# Patient Record
Sex: Female | Born: 2000 | Race: Black or African American | Hispanic: No | Marital: Single | Smoking: Never smoker
Health system: Southern US, Community
[De-identification: ages and names within clinical notes are randomized; demographics above are authoritative.]

## PROBLEM LIST (undated history)

## (undated) ENCOUNTER — Ambulatory Visit (HOSPITAL_COMMUNITY): Payer: Medicaid Other

## (undated) DIAGNOSIS — S82891A Other fracture of right lower leg, initial encounter for closed fracture: Secondary | ICD-10-CM

## (undated) DIAGNOSIS — Z87820 Personal history of traumatic brain injury: Secondary | ICD-10-CM

---
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
## 2021-05-25 DIAGNOSIS — Z01411 Encounter for gynecological examination (general) (routine) with abnormal findings: Secondary | ICD-10-CM | POA: Diagnosis not present

## 2021-05-25 DIAGNOSIS — R87610 Atypical squamous cells of undetermined significance on cytologic smear of cervix (ASC-US): Secondary | ICD-10-CM | POA: Diagnosis not present

## 2021-05-25 DIAGNOSIS — Z7251 High risk heterosexual behavior: Secondary | ICD-10-CM | POA: Diagnosis not present

## 2021-05-25 DIAGNOSIS — Z1239 Encounter for other screening for malignant neoplasm of breast: Secondary | ICD-10-CM | POA: Diagnosis not present

## 2021-05-25 DIAGNOSIS — R875 Abnormal microbiological findings in specimens from female genital organs: Secondary | ICD-10-CM | POA: Diagnosis not present

## 2021-05-25 DIAGNOSIS — B373 Candidiasis of vulva and vagina: Secondary | ICD-10-CM | POA: Diagnosis not present

## 2021-10-12 DIAGNOSIS — Z1239 Encounter for other screening for malignant neoplasm of breast: Secondary | ICD-10-CM | POA: Diagnosis not present

## 2021-10-12 DIAGNOSIS — Z7189 Other specified counseling: Secondary | ICD-10-CM | POA: Diagnosis not present

## 2021-10-14 DIAGNOSIS — Z113 Encounter for screening for infections with a predominantly sexual mode of transmission: Secondary | ICD-10-CM | POA: Diagnosis not present

## 2021-10-14 DIAGNOSIS — Z719 Counseling, unspecified: Secondary | ICD-10-CM | POA: Diagnosis not present

## 2021-10-14 DIAGNOSIS — Z7251 High risk heterosexual behavior: Secondary | ICD-10-CM | POA: Diagnosis not present

## 2021-10-14 DIAGNOSIS — N760 Acute vaginitis: Secondary | ICD-10-CM | POA: Diagnosis not present

## 2022-01-27 DIAGNOSIS — N390 Urinary tract infection, site not specified: Secondary | ICD-10-CM | POA: Diagnosis not present

## 2022-01-27 DIAGNOSIS — T3695XA Adverse effect of unspecified systemic antibiotic, initial encounter: Secondary | ICD-10-CM | POA: Diagnosis not present

## 2022-01-27 DIAGNOSIS — B379 Candidiasis, unspecified: Secondary | ICD-10-CM | POA: Diagnosis not present

## 2023-10-22 ENCOUNTER — Emergency Department (HOSPITAL_COMMUNITY): Payer: Medicaid Other

## 2023-10-22 ENCOUNTER — Emergency Department (HOSPITAL_COMMUNITY)
Admission: EM | Admit: 2023-10-22 | Discharge: 2023-10-22 | Disposition: A | Discharge status: Home / Self Care | Payer: Medicaid Other | Attending: Emergency Medicine | Admitting: Emergency Medicine

## 2023-10-22 ENCOUNTER — Other Ambulatory Visit: Payer: Self-pay

## 2023-10-22 ENCOUNTER — Encounter (HOSPITAL_COMMUNITY): Payer: Self-pay

## 2023-10-22 DIAGNOSIS — Z043 Encounter for examination and observation following other accident: Secondary | ICD-10-CM | POA: Diagnosis not present

## 2023-10-22 DIAGNOSIS — M25571 Pain in right ankle and joints of right foot: Secondary | ICD-10-CM | POA: Diagnosis present

## 2023-10-22 DIAGNOSIS — X509XXA Other and unspecified overexertion or strenuous movements or postures, initial encounter: Secondary | ICD-10-CM | POA: Insufficient documentation

## 2023-10-22 DIAGNOSIS — S93401A Sprain of unspecified ligament of right ankle, initial encounter: Secondary | ICD-10-CM | POA: Diagnosis not present

## 2023-10-22 DIAGNOSIS — Y990 Civilian activity done for income or pay: Secondary | ICD-10-CM | POA: Insufficient documentation

## 2023-10-22 NOTE — ED Triage Notes (Signed)
Pt arrives with c/o right ankle pain after falling at work. Per pt, she rolled her ankle. Pt ambulatory to triage.

## 2023-10-22 NOTE — Discharge Instructions (Signed)
Take over-the-counter medications as needed for pain and discomfort.  Follow-up with an orthopedic doctor to be rechecked if the symptoms persist

## 2023-10-22 NOTE — ED Provider Notes (Signed)
[place] EMERGENCY DEPARTMENT AT [facility] Provider Note   CSN: [id number] Arrival date & time: 10/22/23  1646     History  Chief Complaint  Patient presents with   Ankle Pain    Martha Mills is a 22 y.o. female.   Ankle Pain    Patient presents to the ED for evaluation of ankle pain.  Patient states she was at work today when she rolled her ankle.  Patient does have a history of prior fracture in that same ankle that required surgical repair.  This was back in 2017.  Patient states she has been able to walk on her ankle.  She did fall and injured her knee as well but that is only mild and is not particularly tender.  The pain is mostly on the lateral aspect of her right ankle  Home Medications Prior to Admission medications   Medication Sig Start Date End Date Taking? Authorizing Provider  HYDROcodone-acetaminophen (NORCO) 5-325 MG tablet Take 1-2 tablets by mouth every 6 (six) hours as needed for moderate pain. 06/24/16   Sheral Apley, MD  naproxen sodium (ANAPROX) 220 MG tablet Take 220 mg by mouth 2 (two) times daily with a meal.    [provider]  ondansetron (ZOFRAN) 4 MG tablet Take 1 tablet (4 mg total) by mouth every 8 (eight) hours as needed for nausea or vomiting. 06/24/16   Sheral Apley, MD      Allergies    Patient has no known allergies.    Review of Systems   Review of Systems  Physical Exam Updated Vital Signs BP 124/75   Pulse 85   Temp 98.2 F (36.8 C) (Oral)   Resp 18   Ht 1.702 m (5\' 7" )   Wt 88.5 kg   LMP 10/22/2023 (Approximate)   SpO2 99%   BMI 30.54 kg/m  Physical Exam Vitals and nursing note reviewed.  Constitutional:      General: She is not in acute distress.    Appearance: She is well-developed.  HENT:     Head: Normocephalic and atraumatic.     Right Ear: External ear normal.     Left Ear: External ear normal.  Eyes:     General: No scleral icterus.       Right eye: No discharge.        Left  eye: No discharge.     Conjunctiva/sclera: Conjunctivae normal.  Neck:     Trachea: No tracheal deviation.  Cardiovascular:     Rate and Rhythm: Normal rate.  Pulmonary:     Effort: Pulmonary effort is normal. No respiratory distress.     Breath sounds: No stridor.  Abdominal:     General: There is no distension.  Musculoskeletal:        General: No swelling or deformity.     Cervical back: Neck supple.     Comments: Mild tenderness lateral malleolus, no edema, no proximal fibular tenderness, no fifth metatarsal tenderness  Skin:    General: Skin is warm and dry.     Findings: No rash.  Neurological:     Mental Status: She is alert. Mental status is at baseline.     Cranial Nerves: No dysarthria or facial asymmetry.     Motor: No seizure activity.     ED Results / Procedures / Treatments   Labs (all labs ordered are listed, but only abnormal results are displayed) Labs Reviewed - No data to display  EKG None  Radiology DG Ankle 2 Views Right  Result Date: 10/22/2023 CLINICAL DATA:  Status post fall. EXAM: RIGHT ANKLE - 2 VIEW COMPARISON:  None Available. FINDINGS: Small radiopaque surgical plates and screws are seen along the regions just above the right lateral malleolus and right medial malleolus. There is no evidence of an acute fracture, dislocation, or joint effusion. There is no evidence of arthropathy or other focal bone abnormality. Soft tissues are unremarkable. IMPRESSION: Postoperative changes without an acute fracture or dislocation. Electronically Signed   By: Aram Candela M.D.   On: 10/22/2023 18:02    Procedures Procedures    Medications Ordered in ED Medications - No data to display  ED Course/ Medical Decision Making/ A&P                                 Medical Decision Making Problems Addressed: Sprain of right ankle, unspecified ligament, initial encounter: acute illness or injury that poses a threat to life or bodily functions  Amount  and/or Complexity of Data Reviewed Radiology: ordered and independent interpretation performed.   Patient presented to the ED for evaluation of ankle pain.  Patient previously had surgery in that same ankle.  No significant edema noted.  X-ray does not show any signs of fracture.  Symptoms consistent with ankle sprain.  Will discharge home with brace, supportive care.        Final Clinical Impression(s) / ED Diagnoses Final diagnoses:  Sprain of right ankle, unspecified ligament, initial encounter    Rx / DC Orders ED Discharge Orders     None         Linwood Dibbles, MD 10/22/23 1814

## 2023-10-22 NOTE — ED Notes (Signed)
Pt coming to bed from X-ray.

## 2024-11-25 ENCOUNTER — Emergency Department (HOSPITAL_BASED_OUTPATIENT_CLINIC_OR_DEPARTMENT_OTHER)
Admission: EM | Admit: 2024-11-25 | Discharge: 2024-11-25 | Disposition: A | Discharge status: Home / Self Care | Attending: Emergency Medicine | Admitting: Emergency Medicine

## 2024-11-25 ENCOUNTER — Encounter (HOSPITAL_BASED_OUTPATIENT_CLINIC_OR_DEPARTMENT_OTHER): Payer: Self-pay | Admitting: Emergency Medicine

## 2024-11-25 ENCOUNTER — Emergency Department (HOSPITAL_BASED_OUTPATIENT_CLINIC_OR_DEPARTMENT_OTHER)

## 2024-11-25 ENCOUNTER — Other Ambulatory Visit: Payer: Self-pay

## 2024-11-25 DIAGNOSIS — R110 Nausea: Secondary | ICD-10-CM | POA: Diagnosis not present

## 2024-11-25 DIAGNOSIS — Z79899 Other long term (current) drug therapy: Secondary | ICD-10-CM | POA: Diagnosis not present

## 2024-11-25 DIAGNOSIS — S39012A Strain of muscle, fascia and tendon of lower back, initial encounter: Secondary | ICD-10-CM | POA: Diagnosis not present

## 2024-11-25 DIAGNOSIS — M25572 Pain in left ankle and joints of left foot: Secondary | ICD-10-CM | POA: Diagnosis not present

## 2024-11-25 DIAGNOSIS — S161XXA Strain of muscle, fascia and tendon at neck level, initial encounter: Secondary | ICD-10-CM | POA: Diagnosis not present

## 2024-11-25 DIAGNOSIS — R109 Unspecified abdominal pain: Secondary | ICD-10-CM | POA: Diagnosis not present

## 2024-11-25 DIAGNOSIS — M25571 Pain in right ankle and joints of right foot: Secondary | ICD-10-CM | POA: Diagnosis not present

## 2024-11-25 DIAGNOSIS — Z041 Encounter for examination and observation following transport accident: Secondary | ICD-10-CM | POA: Diagnosis not present

## 2024-11-25 DIAGNOSIS — S199XXA Unspecified injury of neck, initial encounter: Secondary | ICD-10-CM | POA: Diagnosis present

## 2024-11-25 DIAGNOSIS — S99912A Unspecified injury of left ankle, initial encounter: Secondary | ICD-10-CM | POA: Diagnosis not present

## 2024-11-25 DIAGNOSIS — Y92410 Unspecified street and highway as the place of occurrence of the external cause: Secondary | ICD-10-CM | POA: Diagnosis not present

## 2024-11-25 LAB — LIPASE, BLOOD: Lipase: <10 U/L — ABNORMAL LOW (ref 11–51)

## 2024-11-25 LAB — CBC
HCT: 42 % (ref 36.0–46.0)
Hemoglobin: 13.8 g/dL (ref 12.0–15.0)
MCH: 28.3 pg (ref 26.0–34.0)
MCHC: 32.9 g/dL (ref 30.0–36.0)
MCV: 86.1 fL (ref 80.0–100.0)
Platelets: 312 K/uL (ref 150–400)
RBC: 4.88 MIL/uL (ref 3.87–5.11)
RDW: 12.9 % (ref 11.5–15.5)
WBC: 4.9 K/uL (ref 4.0–10.5)
nRBC: 0 % (ref 0.0–0.2)

## 2024-11-25 LAB — COMPREHENSIVE METABOLIC PANEL WITH GFR
ALT: 12 U/L (ref 0–44)
AST: 20 U/L (ref 15–41)
Albumin: 3.9 g/dL (ref 3.5–5.0)
Alkaline Phosphatase: 57 U/L (ref 38–126)
Anion gap: 10 (ref 5–15)
BUN: 10 mg/dL (ref 6–20)
CO2: 22 mmol/L (ref 22–32)
Calcium: 9.1 mg/dL (ref 8.9–10.3)
Chloride: 107 mmol/L (ref 98–111)
Creatinine, Ser: 0.72 mg/dL (ref 0.44–1.00)
GFR, Estimated: >60 mL/min (ref >60)
Glucose, Bld: 91 mg/dL (ref 70–99)
Potassium: 4.1 mmol/L (ref 3.5–5.1)
Sodium: 139 mmol/L (ref 135–145)
Total Bilirubin: 0.4 mg/dL (ref 0.0–1.2)
Total Protein: 6.5 g/dL (ref 6.5–8.1)

## 2024-11-25 LAB — RESP PANEL BY RT-PCR (RSV, FLU A&B, COVID)  RVPGX2
Influenza A by PCR: NEGATIVE
Influenza B by PCR: NEGATIVE
Resp Syncytial Virus by PCR: NEGATIVE
SARS Coronavirus 2 by RT PCR: NEGATIVE

## 2024-11-25 LAB — PREGNANCY, URINE: Preg Test, Ur: NEGATIVE

## 2024-11-25 MED ORDER — ACETAMINOPHEN 325 MG PO TABS
650.0000 mg | ORAL_TABLET | Freq: Once | ORAL | Status: AC
  Administered 2024-11-25: 13:00:00 650 mg via ORAL
  Filled 2024-11-25: qty 2

## 2024-11-25 MED ORDER — ONDANSETRON 4 MG PO TBDP: 4.0000 mg | ORAL_TABLET | Freq: Four times a day (QID) | ORAL | 0 refills | Status: AC | PRN

## 2024-11-25 MED ORDER — METHOCARBAMOL 500 MG PO TABS: 500.0000 mg | ORAL_TABLET | Freq: Two times a day (BID) | ORAL | 0 refills | Status: AC

## 2024-11-25 MED ORDER — IBUPROFEN 800 MG PO TABS
800.0000 mg | ORAL_TABLET | Freq: Once | ORAL | Status: AC
  Administered 2024-11-25: 13:00:00 800 mg via ORAL
  Filled 2024-11-25: qty 1

## 2024-11-25 MED ORDER — ONDANSETRON 4 MG PO TBDP
4.0000 mg | ORAL_TABLET | Freq: Once | ORAL | Status: AC
  Administered 2024-11-25: 13:00:00 4 mg via ORAL
  Filled 2024-11-25: qty 1

## 2024-11-25 NOTE — ED Notes (Signed)
 Xray tech at bedside.
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
## 2024-11-25 NOTE — Discharge Instructions (Signed)

## 2024-11-25 NOTE — ED Notes (Signed)
 ED Provider at bedside.
